# Patient Record
Sex: Female | Born: 1975 | Race: Black or African American | Hispanic: No | Marital: Married | Smoking: Never smoker
Health system: Southern US, Community
[De-identification: ages and names within clinical notes are randomized; demographics above are authoritative.]

---

## 2015-01-16 ENCOUNTER — Emergency Department (HOSPITAL_COMMUNITY)
Admission: EM | Admit: 2015-01-16 | Discharge: 2015-01-16 | Disposition: A | Discharge status: Home / Self Care | Payer: Self-pay | Attending: Emergency Medicine | Admitting: Emergency Medicine

## 2015-01-16 ENCOUNTER — Encounter (HOSPITAL_COMMUNITY): Payer: Self-pay | Admitting: Emergency Medicine

## 2015-01-16 ENCOUNTER — Emergency Department (HOSPITAL_COMMUNITY): Payer: Self-pay

## 2015-01-16 DIAGNOSIS — X509XXA Other and unspecified overexertion or strenuous movements or postures, initial encounter: Secondary | ICD-10-CM | POA: Insufficient documentation

## 2015-01-16 DIAGNOSIS — Y9289 Other specified places as the place of occurrence of the external cause: Secondary | ICD-10-CM | POA: Insufficient documentation

## 2015-01-16 DIAGNOSIS — S93402A Sprain of unspecified ligament of left ankle, initial encounter: Secondary | ICD-10-CM | POA: Insufficient documentation

## 2015-01-16 DIAGNOSIS — Y998 Other external cause status: Secondary | ICD-10-CM | POA: Insufficient documentation

## 2015-01-16 DIAGNOSIS — Y9389 Activity, other specified: Secondary | ICD-10-CM | POA: Insufficient documentation

## 2015-01-16 MED ORDER — IBUPROFEN 200 MG PO TABS: 600.0000 mg | ORAL_TABLET | Freq: Once | ORAL | Status: DC

## 2015-01-16 MED ORDER — IBUPROFEN 600 MG PO TABS: 600.0000 mg | ORAL_TABLET | Freq: Four times a day (QID) | ORAL | Status: AC | PRN

## 2015-01-16 NOTE — ED Notes (Signed)
MD at bedside. 

## 2015-01-16 NOTE — ED Provider Notes (Signed)
CSN: [id number]     Arrival date & time 01/16/15  0125 History   First MD Initiated Contact with Patient 01/16/15 [phone]     Chief Complaint  Patient presents with  . Foot Injury     (Consider location/radiation/quality/duration/timing/severity/associated sxs/prior Treatment) HPI Comments: SUBJECTIVE: Lindsay Gordon is a 39 y.o. female who complains of inversion injury to the right ankle yesterday evening. Immediate symptoms: immediate pain, immediate swelling, inability to bear weight directly after injury. Symptoms have been constant since that time. Prior history of related problems: no prior problems with this area in the past. There is pain and swelling at the lateral aspect of that ankle.   OBJECTIVE: She appears well, vital signs are normal. There is swelling and tenderness over the lateral malleolus. No tenderness over the medial aspect of the ankle. The fifth metatarsal is not tender. The ankle joint is intact without excessive opening on stressing. X-ray: no fracture or dislocation noted The rest of the foot, ankle and leg exam is normal.    Patient is a 39 y.o. female presenting with foot injury. The history is provided by the patient.  Foot Injury   History reviewed. No pertinent past medical history. History reviewed. No pertinent past surgical history. No family history on file. Social History  Substance Use Topics  . Smoking status: Never Smoker   . Smokeless tobacco: None  . Alcohol Use: No   OB History    No data available     Review of Systems  Musculoskeletal: Positive for arthralgias.  Skin: Positive for wound.      Allergies  Review of patient's allergies indicates no known allergies.  Home Medications   Prior to Admission medications   Medication Sig Start Date End Date Taking? Authorizing Provider  ibuprofen (ADVIL,MOTRIN) 600 MG tablet Take 1 tablet (600 mg total) by mouth every 6 (six) hours as needed. 01/16/15   Randal Yepiz, MD   BP 110/61  mmHg  Pulse 72  Temp(Src) 97.4 F (36.3 C) (Oral)  Resp 16  SpO2 100% Physical Exam  Constitutional: She is oriented to person, place, and time. She appears well-developed.  HENT:  Head: Normocephalic and atraumatic.  Eyes: EOM are normal.  Neck: Normal range of motion. Neck supple.  Cardiovascular: Normal rate.   Pulmonary/Chest: Effort normal.  Abdominal: Bowel sounds are normal.  Musculoskeletal:  PLEASE SEE THE NOTE UNDER THE HPI  Neurological: She is alert and oriented to person, place, and time.  Skin: Skin is warm and dry.  Nursing note and vitals reviewed.   ED Course  Procedures (including critical care time) Labs Review Labs Reviewed - No data to display  Imaging Review Dg Foot Complete Left  01/16/2015  CLINICAL DATA:  Patient stepped off of the sidewalk today and twisted the foot. Lateral left foot pain. EXAM: LEFT FOOT - COMPLETE 3+ VIEW COMPARISON:  None. FINDINGS: There is no evidence of fracture or dislocation. There is no evidence of arthropathy or other focal bone abnormality. Soft tissues are unremarkable. IMPRESSION: Negative. Electronically Signed   By: Burman NievesWilliam  Stevens M.D.   On: 01/16/2015 03:20   I have personally reviewed and evaluated these images and lab results as part of my medical decision-making.   EKG Interpretation None      MDM   Final diagnoses:  Ankle sprain, left, initial encounter     ASSESSMENT: Ankle  sprain and contusion  PLAN: rest the injured area as much as practical, apply ice packs, elevate the injured limb,  splint dispensed and applied, crutches dispensed See orders for this visit as documented in the electronic medical record.    Derwood Kaplan, MD 01/16/15 [phone]

## 2015-01-16 NOTE — ED Notes (Signed)
Pt c/o L foot pain after twisting foot/ankle today while stepping off sidewalk

## 2015-01-16 NOTE — Discharge Instructions (Signed)
You have an ankle sprain. No fracture seen. Use the crutches and start putting slight weight on the leg as when you feel comfortable.  See the doctor as requested.   Ankle Sprain An ankle sprain is an injury to the strong, fibrous tissues (ligaments) that hold the bones of your ankle joint together.  CAUSES An ankle sprain is usually caused by a fall or by twisting your ankle. Ankle sprains most commonly occur when you step on the outer edge of your foot, and your ankle turns inward. People who participate in sports are more prone to these types of injuries.  SYMPTOMS   Pain in your ankle. The pain may be present at rest or only when you are trying to stand or walk.  Swelling.  Bruising. Bruising may develop immediately or within 1 to 2 days after your injury.  Difficulty standing or walking, particularly when turning corners or changing directions. DIAGNOSIS  Your caregiver will ask you details about your injury and perform a physical exam of your ankle to determine if you have an ankle sprain. During the physical exam, your caregiver will press on and apply pressure to specific areas of your foot and ankle. Your caregiver will try to move your ankle in certain ways. An X-ray exam may be done to be sure a bone was not broken or a ligament did not separate from one of the bones in your ankle (avulsion fracture).  TREATMENT  Certain types of braces can help stabilize your ankle. Your caregiver can make a recommendation for this. Your caregiver may recommend the use of medicine for pain. If your sprain is severe, your caregiver may refer you to a surgeon who helps to restore function to parts of your skeletal system (orthopedist) or a physical therapist. HOME CARE INSTRUCTIONS   Apply ice to your injury for 1-2 days or as directed by your caregiver. Applying ice helps to reduce inflammation and pain.  Put ice in a plastic bag.  Place a towel between your skin and the bag.  Leave the ice  on for 15-20 minutes at a time, every 2 hours while you are awake.  Only take over-the-counter or prescription medicines for pain, discomfort, or fever as directed by your caregiver.  Elevate your injured ankle above the level of your heart as much as possible for 2-3 days.  If your caregiver recommends crutches, use them as instructed. Gradually put weight on the affected ankle. Continue to use crutches or a cane until you can walk without feeling pain in your ankle.  If you have a plaster splint, wear the splint as directed by your caregiver. Do not rest it on anything harder than a pillow for the first 24 hours. Do not put weight on it. Do not get it wet. You may take it off to take a shower or bath.  You may have been given an elastic bandage to wear around your ankle to provide support. If the elastic bandage is too tight (you have numbness or tingling in your foot or your foot becomes cold and blue), adjust the bandage to make it comfortable.  If you have an air splint, you may blow more air into it or let air out to make it more comfortable. You may take your splint off at night and before taking a shower or bath. Wiggle your toes in the splint several times per day to decrease swelling. SEEK MEDICAL CARE IF:   You have rapidly increasing bruising or swelling.  Your toes feel extremely cold or you lose feeling in your foot.  Your pain is not relieved with medicine. SEEK IMMEDIATE MEDICAL CARE IF:  Your toes are numb or blue.  You have severe pain that is increasing. MAKE SURE YOU:   Understand these instructions.  Will watch your condition.  Will get help right away if you are not doing well or get worse.   This information is not intended to replace advice given to you by your health care provider. Make sure you discuss any questions you have with your health care provider.   Document Released: 02/05/2005 Document Revised: 02/26/2014 Document Reviewed: 02/17/2011 Elsevier  Interactive Patient Education 2016 Elsevier Inc. RICE for Routine Care of Injuries Theroutine careofmanyinjuriesincludes rest, ice, compression, and elevation (RICE therapy). RICE therapy is often recommended for injuries to soft tissues, such as a muscle strain, ligament injuries, bruises, and overuse injuries. It can also be used for some bony injuries. Using RICE therapy can help to relieve pain, lessen swelling, and enable your body to heal. Rest Rest is required to allow your body to heal. This usually involves reducing your normal activities and avoiding use of the injured part of your body. Generally, you can return to your normal activities when you are comfortable and have been given permission by your health care provider. Ice Icing your injury helps to keep the swelling down, and it lessens pain. Do not apply ice directly to your skin.  Put ice in a plastic bag.  Place a towel between your skin and the bag.  Leave the ice on for 20 minutes, 2-3 times a day. Do this for as long as you are directed by your health care provider. Compression Compression means putting pressure on the injured area. Compression helps to keep swelling down, gives support, and helps with discomfort. Compression may be done with an elastic bandage. If an elastic bandage has been applied, follow these general tips:  Remove and reapply the bandage every 3-4 hours or as directed by your health care provider.  Make sure the bandage is not wrapped too tightly, because this can cut off circulation. If part of your body beyond the bandage becomes blue, numb, cold, swollen, or more painful, your bandage is most likely too tight. If this occurs, remove your bandage and reapply it more loosely.  See your health care provider if the bandage seems to be making your problems worse rather than better. Elevation Elevation means keeping the injured area raised. This helps to lessen swelling and decrease pain. If  possible, your injured area should be elevated at or above the level of your heart or the center of your chest. WHEN SHOULD I SEEK MEDICAL CARE? You should seek medical care if:  Your pain and swelling continue.  Your symptoms are getting worse rather than improving. These symptoms may indicate that further evaluation or further X-rays are needed. Sometimes, X-rays may not show a small broken bone (fracture) until a number of days later. Make a follow-up appointment with your health care provider. WHEN SHOULD I SEEK IMMEDIATE MEDICAL CARE? You should seek immediate medical care if:  You have sudden severe pain at or below the area of your injury.  You have redness or increased swelling around your injury.  You have tingling or numbness at or below the area of your injury that does not improve after you remove the elastic bandage.   This information is not intended to replace advice given to you by your health  care provider. Make sure you discuss any questions you have with your health care provider.   Document Released: 05/20/2000 Document Revised: 10/27/2014 Document Reviewed: 01/13/2014 Elsevier Interactive Patient Education [employer]2016 Elsevier Inc.

## 2015-07-25 ENCOUNTER — Emergency Department (HOSPITAL_COMMUNITY)
Admission: EM | Admit: 2015-07-25 | Discharge: 2015-07-25 | Disposition: A | Discharge status: Home / Self Care | Payer: Self-pay | Attending: Emergency Medicine | Admitting: Emergency Medicine

## 2015-07-25 ENCOUNTER — Emergency Department (HOSPITAL_COMMUNITY): Payer: Self-pay

## 2015-07-25 ENCOUNTER — Encounter (HOSPITAL_COMMUNITY): Payer: Self-pay | Admitting: Emergency Medicine

## 2015-07-25 DIAGNOSIS — Y929 Unspecified place or not applicable: Secondary | ICD-10-CM | POA: Insufficient documentation

## 2015-07-25 DIAGNOSIS — Y939 Activity, unspecified: Secondary | ICD-10-CM | POA: Insufficient documentation

## 2015-07-25 DIAGNOSIS — S20112A Abrasion of breast, left breast, initial encounter: Secondary | ICD-10-CM | POA: Insufficient documentation

## 2015-07-25 DIAGNOSIS — Y999 Unspecified external cause status: Secondary | ICD-10-CM | POA: Insufficient documentation

## 2015-07-25 DIAGNOSIS — M79644 Pain in right finger(s): Secondary | ICD-10-CM | POA: Insufficient documentation
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
## 2015-07-25 NOTE — ED Notes (Signed)
Social work notified to see patient to provide resources
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
## 2015-07-25 NOTE — Discharge Instructions (Signed)
General Assault  Assault includes any behavior or physical attack--whether it is on purpose or not--that results in injury to another person, damage to property, or both. This also includes assault that has not yet happened, but is planned to happen. Threats of assault may be physical, verbal, or written. They may be said or sent by:   Mail.   E-mail.   Text.   Social media.   Fax.  The threats may be direct, implied, or understood.  WHAT ARE THE DIFFERENT FORMS OF ASSAULT?  Forms of assault include:   Physically assaulting a person. This includes physical threats to inflict physical harm as well as:    Slapping.    Hitting.    Poking.    Kicking.    Punching.    Pushing.   Sexually assaulting a person. Sexual assault is any sexual activity that a person is forced, threatened, or coerced to participate in. It may or may not involve physical contact with the person who is assaulting you. You are sexually assaulted if you are forced to have sexual contact of any kind.   Damaging or destroying a person's assistive equipment, such as glasses, canes, or walkers.   Throwing or hitting objects.   Using or displaying a weapon to harm or threaten someone.   Using or displaying an object that appears to be a weapon in a threatening manner.   Using greater physical size or strength to intimidate someone.   Making intimidating or threatening gestures.   Bullying.   Hazing.   Using language that is intimidating, threatening, hostile, or abusive.   Stalking.   Restraining someone with force.  WHAT SHOULD I DO IF I EXPERIENCE ASSAULT?   Report assaults, threats, and stalking to the police. Call your local emergency services (911 in the [place] if you are in immediate danger or you need medical help.   You can work with a lawyer or an advocate to get legal protection against someone who has assaulted you or threatened you with assault. Protection includes restraining orders and private addresses. Crimes against  you, such as assault, can also be prosecuted through the courts. Laws will vary depending on where you live.     This information is not intended to replace advice given to you by your health care provider. Make sure you discuss any questions you have with your health care provider.     Document Released: 02/05/2005 Document Revised: 02/26/2014 Document Reviewed: 10/23/2013  Elsevier Interactive Patient Education 2016 Elsevier Inc.
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
## 2016-07-01 ENCOUNTER — Encounter (HOSPITAL_COMMUNITY): Payer: Self-pay | Admitting: Emergency Medicine

## 2016-07-01 ENCOUNTER — Emergency Department (HOSPITAL_COMMUNITY)
Admission: EM | Admit: 2016-07-01 | Discharge: 2016-07-01 | Disposition: A | Discharge status: Home / Self Care | Payer: Self-pay | Attending: Emergency Medicine | Admitting: Emergency Medicine

## 2016-07-01 DIAGNOSIS — J069 Acute upper respiratory infection, unspecified: Secondary | ICD-10-CM | POA: Insufficient documentation

## 2016-07-01 DIAGNOSIS — S61210A Laceration without foreign body of right index finger without damage to nail, initial encounter: Secondary | ICD-10-CM | POA: Insufficient documentation

## 2016-07-01 DIAGNOSIS — W25XXXA Contact with sharp glass, initial encounter: Secondary | ICD-10-CM | POA: Insufficient documentation

## 2016-07-01 DIAGNOSIS — Y999 Unspecified external cause status: Secondary | ICD-10-CM | POA: Insufficient documentation

## 2016-07-01 DIAGNOSIS — Y929 Unspecified place or not applicable: Secondary | ICD-10-CM | POA: Insufficient documentation

## 2016-07-01 DIAGNOSIS — Y939 Activity, unspecified: Secondary | ICD-10-CM | POA: Insufficient documentation
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
## 2016-07-01 NOTE — Discharge Instructions (Signed)
Please read instructions below. Keep your wound clean, dry, covered, and in the splint for at least 7 days to allow your wound to heal.  Watch for signs of infection, including pus draining from wound, increased pain, or fever.  Follow up at the Urgent Care clinic for a wound recheck in 5 to 7 days. You can take Advil (ibuprofen) or Tylenol (acetaminophen) as needed for finger pain or sore throat.  You can also drink tea with honey to soothe your throat.  You can use a saline spray in your nose for congestion. The local pharmacy will help you with finding the medications as listed above. Return to the ER if you see signs of infection, if you have difficulty swallowing or breathing, or new or worsening symptoms.
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
## 2016-11-08 IMAGING — CR DG FOOT COMPLETE 3+V*L*
3 series · 3 of 3 positions shown · non-contrast
Comparison: None.

CLINICAL DATA: Patient stepped off of the sidewalk today and
twisted the foot. Lateral left foot pain.

EXAM:
LEFT FOOT - COMPLETE 3+ VIEW

[x foot ap left]
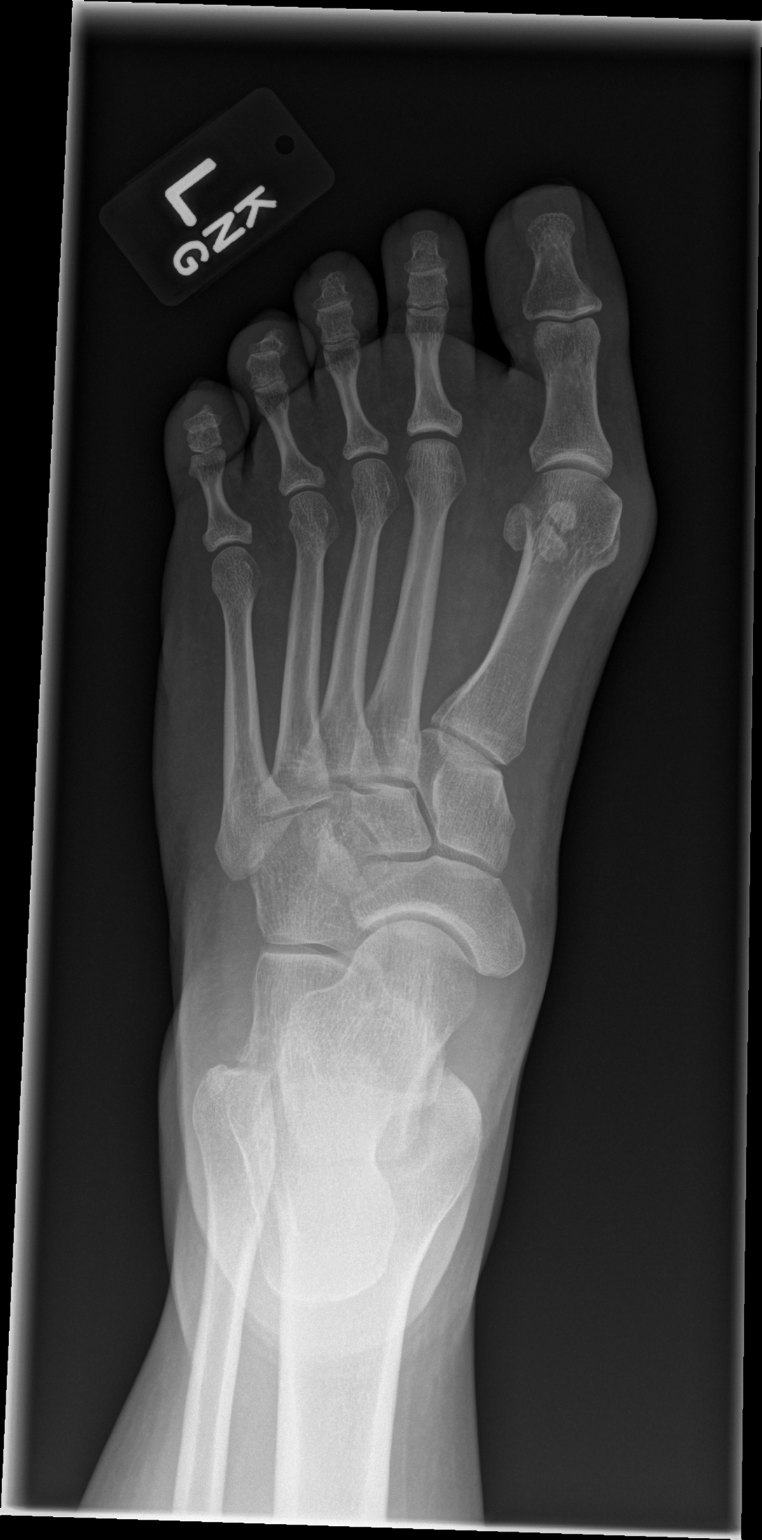

[x foot obl left]
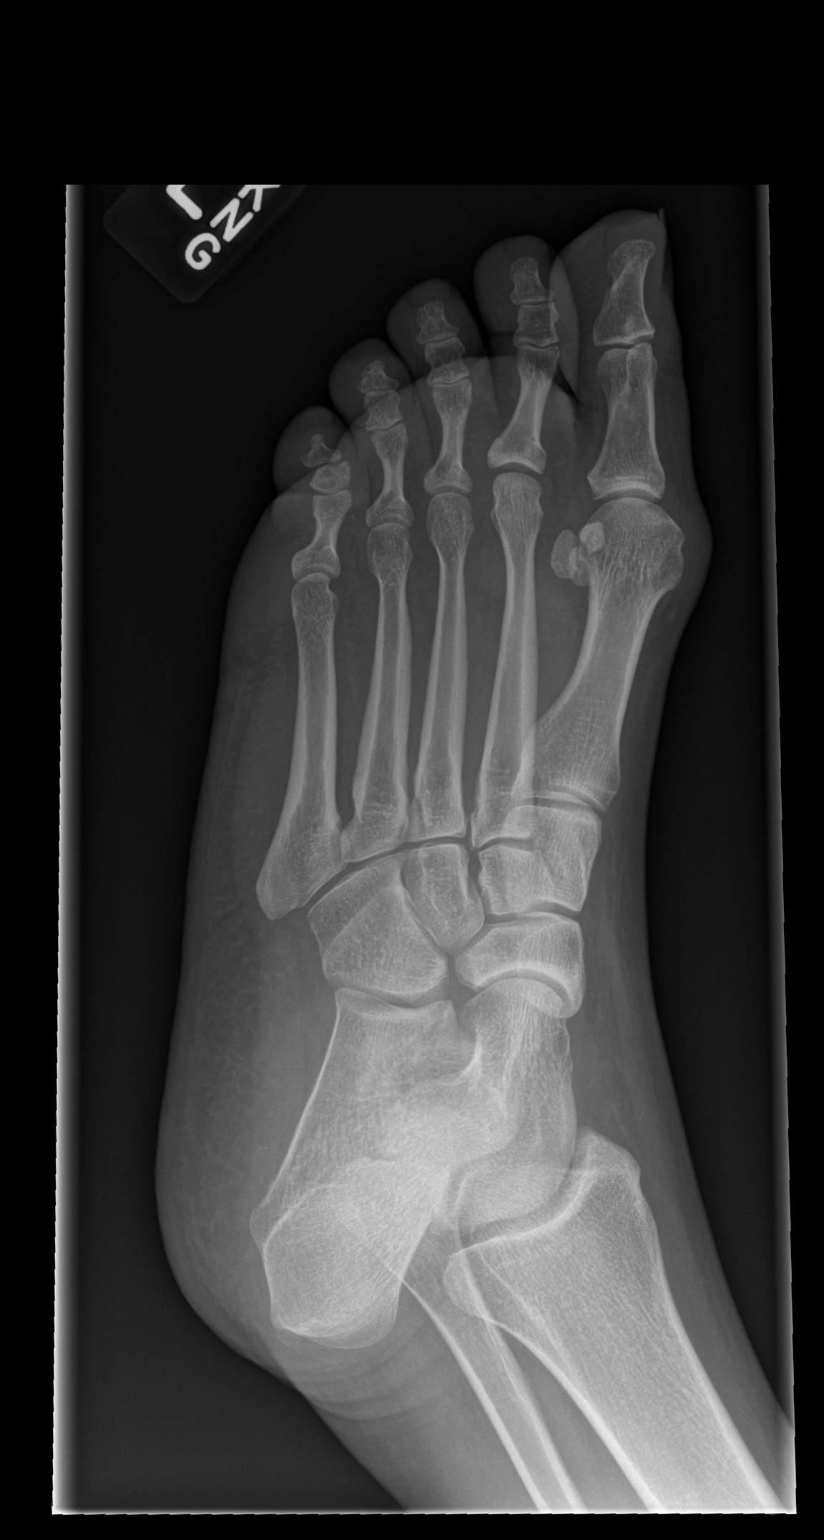

[x foot lat left]
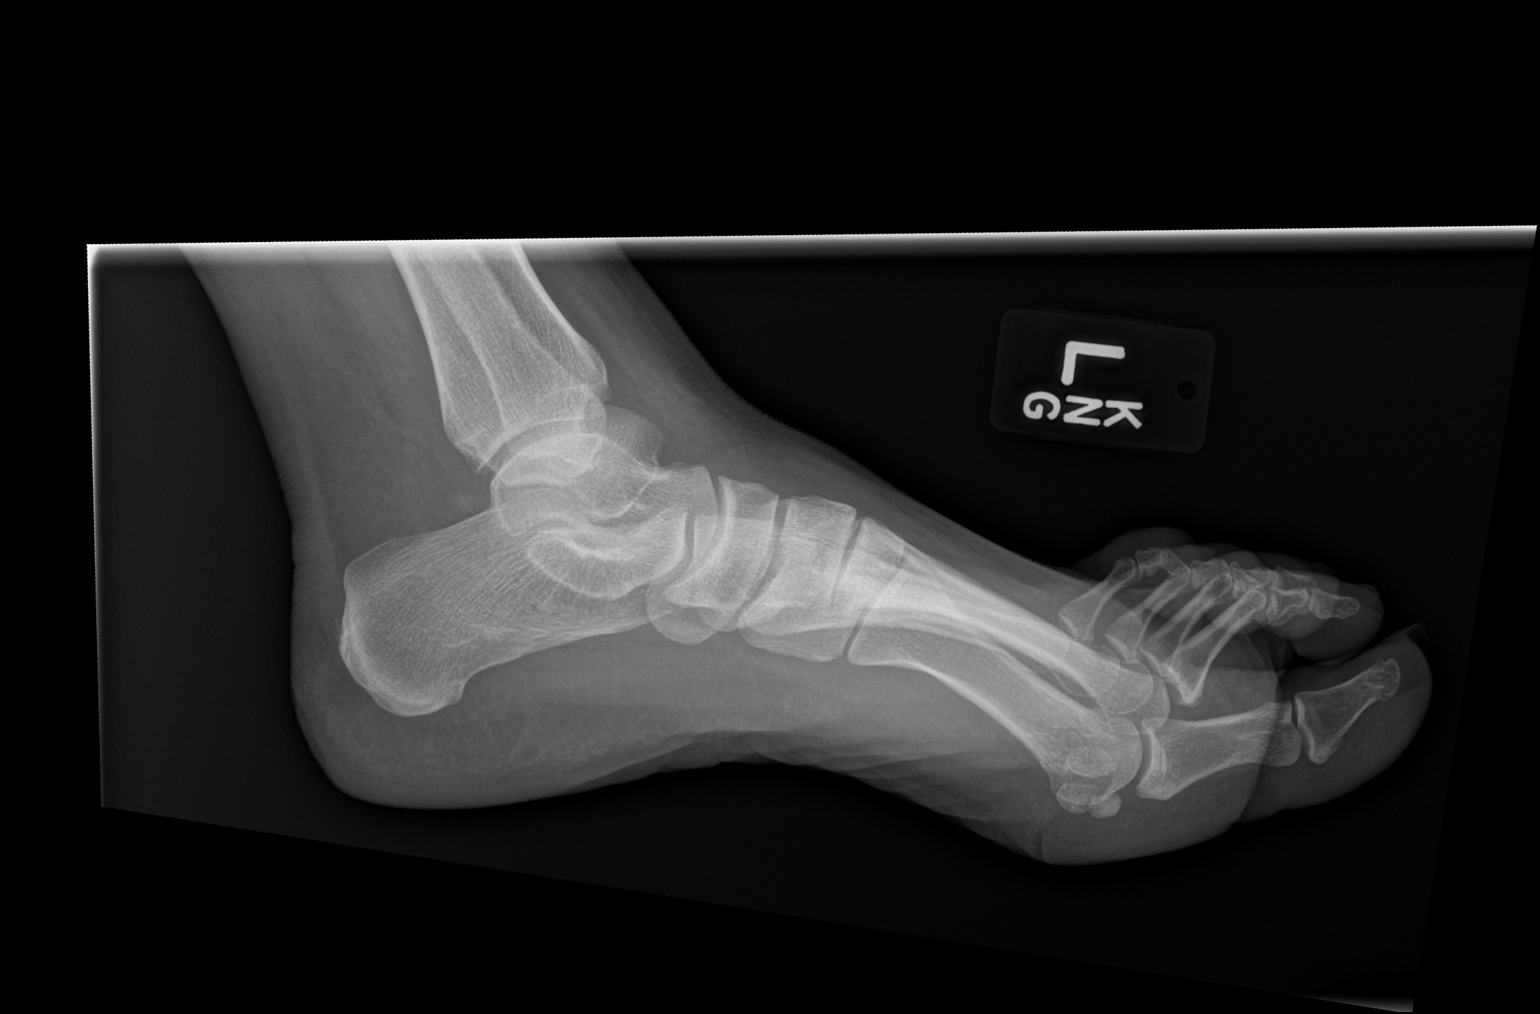

[3 of 3 positions shown; findings below may reference images not displayed]

FINDINGS: There is no evidence of fracture or dislocation. There is no
evidence of arthropathy or other focal bone abnormality. Soft
tissues are unremarkable.
IMPRESSION: Negative.
